# Patient Record
Sex: Male | Born: 1990 | Race: Black or African American | Hispanic: No | Marital: Married | Smoking: Never smoker
Health system: Southern US, Community
[De-identification: ages and names within clinical notes are randomized; demographics above are authoritative.]

## PROBLEM LIST (undated history)

## (undated) DIAGNOSIS — I4891 Unspecified atrial fibrillation: Secondary | ICD-10-CM

## (undated) HISTORY — PX: BUNIONECTOMY: SHX129

---
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
## 2016-07-14 ENCOUNTER — Ambulatory Visit (INDEPENDENT_AMBULATORY_CARE_PROVIDER_SITE_OTHER): Payer: BLUE CROSS/BLUE SHIELD

## 2016-07-14 ENCOUNTER — Ambulatory Visit (INDEPENDENT_AMBULATORY_CARE_PROVIDER_SITE_OTHER): Payer: BLUE CROSS/BLUE SHIELD | Admitting: Podiatry

## 2016-07-14 ENCOUNTER — Encounter: Payer: Self-pay | Admitting: Podiatry

## 2016-07-14 VITALS — Resp 16 | Ht 67.0 in | Wt 175.0 lb

## 2016-07-14 DIAGNOSIS — M21629 Bunionette of unspecified foot: Secondary | ICD-10-CM | POA: Diagnosis not present

## 2016-07-14 DIAGNOSIS — M2010 Hallux valgus (acquired), unspecified foot: Secondary | ICD-10-CM

## 2016-07-14 NOTE — Progress Notes (Signed)
   Subjective:    Patient ID: Gabriel Berger, male    DOB: Feb 16, 1991, 26 y.o.   MRN: [id number]  HPI  Chief Complaint  Patient presents with  . Bunions    BL; Painful x 1 year.        Review of Systems     Objective:   Physical Exam        Assessment & Plan:

## 2016-07-14 NOTE — Patient Instructions (Addendum)
Pre-Operative Instructions  Congratulations, you have decided to take an important step to improving your quality of life.  You can be assured that the doctors of [facility] will be with you every step of the way.  1. Plan to be at the surgery center/hospital at least 1 (one) hour prior to your scheduled time unless otherwise directed by the surgical center/hospital staff.  You must have a responsible adult accompany you, remain during the surgery and drive you home.  Make sure you have directions to the surgical center/hospital and know how to get there on time. 2. For hospital based surgery you will need to obtain a history and physical form from your family physician within 1 month prior to the date of surgery- we will give you a form for you primary physician.  3. We make every effort to accommodate the date you request for surgery.  There are however, times where surgery dates or times have to be moved.  We will contact you as soon as possible if a change in schedule is required.   4. No Aspirin/Ibuprofen for one week before surgery.  If you are on aspirin, any non-steroidal anti-inflammatory medications (Mobic, Aleve, Ibuprofen) you should stop taking it 7 days prior to your surgery.  You make take Tylenol  For pain prior to surgery.  5. Medications- If you are taking daily heart and blood pressure medications, seizure, reflux, allergy, asthma, anxiety, pain or diabetes medications, make sure the surgery center/hospital is aware before the day of surgery so they may notify you which medications to take or avoid the day of surgery. 6. No food or drink after midnight the night before surgery unless directed otherwise by surgical center/hospital staff. 7. No alcoholic beverages 24 hours prior to surgery.  No smoking 24 hours prior to or 24 hours after surgery. 8. Wear loose pants or shorts- loose enough to fit over bandages, boots, and casts. 9. No slip on shoes, sneakers are best. 10. Bring  your boot with you to the surgery center/hospital.  Also bring crutches or a walker if your physician has prescribed it for you.  If you do not have this equipment, it will be provided for you after surgery. 11. If you have not been contracted by the surgery center/hospital by the day before your surgery, call to confirm the date and time of your surgery. 12. Leave-time from work may vary depending on the type of surgery you have.  Appropriate arrangements should be made prior to surgery with your employer. 13. Prescriptions will be provided immediately following surgery by your doctor.  Have these filled as soon as possible after surgery and take the medication as directed. 14. Remove nail polish on the operative foot. 15. Wash the night before surgery.  The night before surgery wash the foot and leg well with the antibacterial soap provided and water paying special attention to beneath the toenails and in between the toes.  Rinse thoroughly with water and dry well with a towel.  Perform this wash unless told not to do so by your physician.  Enclosed: 1 Ice pack (please put in freezer the night before surgery)   1 Hibiclens skin cleaner   Pre-op Instructions  If you have any questions regarding the instructions, do not hesitate to call our office.  [place]: [address] [place], [place] [address] [phone]  [place]: [address] [place], [place] [address] [phone]  [place]: [address]  [place], [place] [address] [phone]   Dr.   Norman Regal DPM, Dr. Matthew Wagoner DPM, Dr. M. Todd Hyatt DPM, Dr. Titorya Stover DPM    Bunionectomy A bunionectomy is a surgical procedure to remove a bunion. A bunion is a visible bump of bone on the inside of your foot where your big toe meets the rest of your foot. A bunion can develop when pressure turns this bone (first metatarsal) toward the other toes. Shoes that are too tight are the most common cause of bunions. Bunions can also be caused  by diseases, such as arthritis and polio. You may need a bunionectomy if your bunion is very large and painful or it affects your ability to walk. Tell a health care provider about:  Any allergies you have.  All medicines you are taking, including vitamins, herbs, eye drops, creams, and over-the-counter medicines.  Any problems you or family members have had with anesthetic medicines.  Any blood disorders you have.  Any surgeries you have had.  Any medical conditions you have. What are the risks? Generally, this is a safe procedure. However, problems may occur, including:  Infection.  Pain.  Nerve damage.  Bleeding or blood clots.  Reactions to medicines.  Numbness, stiffness, or arthritis in your toe.  Foot problems that continue even after the procedure. What happens before the procedure?  Ask your health care provider about:  Changing or stopping your regular medicines. This is especially important if you are taking diabetes medicines or blood thinners.  Taking medicines such as aspirin and ibuprofen. These medicines can thin your blood. Do not take these medicines before your procedure if your health care provider instructs you not to.  Do not drink alcohol before the procedure as directed by your health care provider.  Do not use tobacco products, including cigarettes, chewing tobacco, or electronic cigarettes, before the procedure as directed by your health care provider. If you need help quitting, ask your health care provider.  Ask your health care provider what kind of medicine you will be given during your procedure. A bunionectomy may be done using one of these:  A medicine that numbs the area (local anesthetic).  A medicine that makes you go to sleep (general anesthetic). If you will be given general anesthetic, do not eat or drink anything after midnight on the night before the procedure or as directed by your health care provider. What happens during the  procedure?  An IV tube may be inserted into a vein.  You will be given local anesthetic or general anesthetic.  The surgeon will make a cut (incision) over the enlarged area at the first joint of the big toe. The surgeon will remove the bunion.  You may have more than one incision if any of the bones in your big toe need to be moved. A bone itself may need to be cut.  Sometimes the tissues around the big toe may also need to be cut then tightened or loosened to reposition the toe.  Screws or other hardware may be used to keep your foot in thecorrect position.  The incision will be closed with stitches (sutures) and covered with adhesive strips or another type of bandage (dressing). What happens after the procedure?  You may spend some time in a recovery area.  Your blood pressure, heart rate, breathing rate, and blood oxygen level will be monitored often until the medicines you were given have worn off. This information is not intended to replace advice given to you by your health care provider. Make sure   questions you have with your health care provider. Document Released: 03/26/2005 Document Revised: 09/18/2015 Document Reviewed: 11/28/2013 Elsevier Interactive Patient Education  2017 [employer]

## 2016-07-14 NOTE — Progress Notes (Signed)
Subjective:     Patient ID: Gabriel Berger, male   DOB: December 24, 1990, 26 y.o.   MRN: [id number]  HPI patient presents with significant bunion deformity left over right with chronic pain of several years duration. States that he's tried wider shoes she's tried soaking it and padding without relief and it just seems to get worse over time. Patient does not know anyone in the family that has it but suspicion is that this is hereditary   Review of Systems  All other systems reviewed and are negative.      Objective:   Physical Exam  Constitutional: He is oriented to person, place, and time.  Cardiovascular: Intact distal pulses.   Musculoskeletal: Normal range of motion.  Neurological: He is oriented to person, place, and time.  Skin: Skin is warm.  Nursing note and vitals reviewed.  neurovascular status found to be intact with muscle strength adequate range of motion within normal limits. Patient's noted to have significant forefoot derangement left over right with large bunion formation that red and painful medial rotation of the hallux left over right and prominent metatarsal fifth left and fifth right that are painful. 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 I do think that this will require surgical intervention due to his young age and pain and he is in complete agreement and he wants to get it done as soon as possible as he has made this decision. I did go ahead today and I allowed him to read the consent form going over alternative treatments complications and that we will do the left foot first and hopefully  follow up with the right foot 2-3 weeks later. I reviewed this in great detail with him and he understands all possible complications as outlined and the recovery from this type procedure we'll take 6 months to one year. Patient is scheduled [facility] and is given all preoperative instructions and is strongly encouraged to call if he should have any questions. I also dispensed air fracture walker that he will begin wearing now to get used to it for the postoperative period and find shoes that we'll work  X-rays indicate that there is elevation of the intermetatarsal angle of approximately 17 left 15 right moderate medial mild rotation of the hallux bilateral and prominence the fourth metatarsal head bilateral

## 2016-07-30 ENCOUNTER — Telehealth: Payer: Self-pay | Admitting: *Deleted

## 2016-07-30 NOTE — Telephone Encounter (Signed)
"  When patient filled out his information on the portal, he put down that he was having surgery on the right foot but all his paperwork says left foot."  Dr. Beverlee Nims notes say the left is the worse and he informed patient that he would do the left foot first.  "Well can you call the patient and let him know?"  Yes, I'll call him.   I received a call from the surgical center.  They said you put on their portal that you were having your right foot done first.  You signed a consent form for the left foot.  "To be honest, I forgot which foot I was having done first.  I'm having both of them done just could not remember which one first."  He's doing the left foot first.  I will call and let the scheduler know at the surgical center.  I called and informed Aram Beecham that patient is having the left foot done first.  She stated she would let the nurse know.

## 2016-08-02 ENCOUNTER — Telehealth: Payer: Self-pay | Admitting: *Deleted

## 2016-08-02 ENCOUNTER — Encounter: Payer: Self-pay | Admitting: *Deleted

## 2016-08-02 NOTE — Telephone Encounter (Addendum)
Pt states he is having bunionectomies on 08/03/2016 and his employer needs verification and an estimated period of time out of work. Pt states he is have both feet done and his doctor said he would be out of work 10-12 weeks. Pt request note be faxed to [place] at [phone]. Letter faxed to [facility].08/03/2016-Kathleen- [employer] states they need a letter stating pt is having surgery today. Retyped and faxed letter dated today with today as surgery date. 08/06/2016-Pt states his insurance would only cover 3 Percocet 10/325 a day and only #21. Dr. Everlena Cooper refill at this time. Pt was instructed to pick up the rx in the [place] office.

## 2016-08-03 ENCOUNTER — Encounter: Payer: Self-pay | Admitting: Podiatry

## 2016-08-03 ENCOUNTER — Encounter: Payer: Self-pay | Admitting: *Deleted

## 2016-08-03 DIAGNOSIS — M21542 Acquired clubfoot, left foot: Secondary | ICD-10-CM | POA: Diagnosis not present

## 2016-08-03 DIAGNOSIS — M2012 Hallux valgus (acquired), left foot: Secondary | ICD-10-CM | POA: Diagnosis not present

## 2016-08-06 MED ORDER — OXYCODONE-ACETAMINOPHEN 10-325 MG PO TABS: 1.0000 | ORAL_TABLET | Freq: Three times a day (TID) | ORAL | 0 refills | Status: DC | PRN

## 2016-08-12 ENCOUNTER — Ambulatory Visit (INDEPENDENT_AMBULATORY_CARE_PROVIDER_SITE_OTHER): Payer: BLUE CROSS/BLUE SHIELD

## 2016-08-12 ENCOUNTER — Ambulatory Visit (INDEPENDENT_AMBULATORY_CARE_PROVIDER_SITE_OTHER): Payer: BLUE CROSS/BLUE SHIELD | Admitting: Podiatry

## 2016-08-12 ENCOUNTER — Encounter: Payer: Self-pay | Admitting: Podiatry

## 2016-08-12 VITALS — Temp 98.3°F

## 2016-08-12 DIAGNOSIS — M2010 Hallux valgus (acquired), unspecified foot: Secondary | ICD-10-CM

## 2016-08-12 DIAGNOSIS — M21629 Bunionette of unspecified foot: Secondary | ICD-10-CM

## 2016-08-12 MED ORDER — OXYCODONE-ACETAMINOPHEN 5-325 MG PO TABS: 1.0000 | ORAL_TABLET | Freq: Three times a day (TID) | ORAL | 0 refills | Status: DC | PRN

## 2016-08-13 NOTE — Progress Notes (Signed)
Subjective:     Patient ID: Gabriel Berger, male   DOB: [birth year], 26 y.o.   MRN: [id number]  HPI patient presents stating that his left foot is doing well and he would like to get the right foot fixed as soon as possible as he will be out of work   Review of Systems     Objective:   Physical Exam Neurovascular status intact negative Homans sign was noted with well coapted incision site left first metatarsal left big toe and left fifth metatarsal. There is excellent alignment noted there is moderate edema across the forefoot from activity that wound edges are well coapted with no drainage     Assessment:     Doing well post foot surgery left with good alignment noted and no indications of pathology currently     Plan:     H&P x-rays were reviewed and compression reapplied with instructions on elevation immobilization and I did dispensed surgical shoe for home usage. I will reevaluate in 2 weeks and we will consider then went would be best to schedule the second surgery for his other foot   X-ray indicates osteotomy is healing well with no movement and good pin screw and wire fixation

## 2016-08-26 ENCOUNTER — Ambulatory Visit (INDEPENDENT_AMBULATORY_CARE_PROVIDER_SITE_OTHER): Payer: BLUE CROSS/BLUE SHIELD

## 2016-08-26 ENCOUNTER — Telehealth: Payer: Self-pay | Admitting: Podiatry

## 2016-08-26 ENCOUNTER — Ambulatory Visit (INDEPENDENT_AMBULATORY_CARE_PROVIDER_SITE_OTHER): Payer: BLUE CROSS/BLUE SHIELD | Admitting: Podiatry

## 2016-08-26 DIAGNOSIS — M2010 Hallux valgus (acquired), unspecified foot: Secondary | ICD-10-CM

## 2016-08-26 DIAGNOSIS — M21629 Bunionette of unspecified foot: Secondary | ICD-10-CM | POA: Diagnosis not present

## 2016-08-26 DIAGNOSIS — Z9889 Other specified postprocedural states: Secondary | ICD-10-CM

## 2016-08-26 NOTE — Telephone Encounter (Signed)
Patient called back saying that his job let him go and he sent in payment for [employer] and that it should show in the next week or two that he still has the insurance but under [employer].

## 2016-08-26 NOTE — Telephone Encounter (Signed)
Called patient in regards to his insurance for his surgery scheduled for next Tuesday 08 May. Asked patient to call us back.

## 2016-08-28 NOTE — Progress Notes (Signed)
Subjective:    Gabriel Berger ID: Gabriel Berger, male   DOB: 26 y.o.   MRN: [id number]   HPI Gabriel Berger states she's doing great and is ready to get his other foot fixed. Like to get it done as soon as possible    ROS      Objective:  Physical Exam Neurovascular status intact muscle strength was adequate with Gabriel Berger found to have well structured first metatarsal left and fifth metatarsal left along with Akin osteotomy. Wound edges well coapted hallux in rectus position with range of motion adequate    Assessment:    Doing well post forefoot reconstruction left and wants right foot fixed     Plan:     H&P condition reviewed and I've recommended that we go ahead and correct the right foot while the left foot finishes healing. I explained procedure and risk and Gabriel Berger wants surgery and today I went ahead and I allowed Gabriel Berger to read consent form after going over alternative treatments complications and the fact recovery will take approximate 6 months to one year. Gabriel Berger signed consent form for right foot and is scheduled for outpatient surgery

## 2016-08-30 NOTE — Telephone Encounter (Signed)
"  I have a surgery scheduled tomorrow morning with Dr. Charlsie Merlesegal.  I have some issues with my insurance.  Can you give me a call back."

## 2016-09-02 ENCOUNTER — Telehealth: Payer: Self-pay | Admitting: *Deleted

## 2016-09-02 NOTE — Telephone Encounter (Signed)
"  I'm calling to see when my surgery was rescheduled to."  We have you scheduled for Sep 14, 2016.  "Okay, thank you."

## 2016-09-06 ENCOUNTER — Other Ambulatory Visit: Payer: BLUE CROSS/BLUE SHIELD

## 2016-09-09 ENCOUNTER — Telehealth: Payer: Self-pay | Admitting: *Deleted

## 2016-09-09 NOTE — Telephone Encounter (Signed)
"  I'm calling to see if I can cancel my surgery scheduled for Tuesday of next week the 22nd.  I just want to get the natural feeling of this other foot that I had done before I have this one done.  If you can, give me a call back.

## 2016-09-10 NOTE — Telephone Encounter (Signed)
"  I'm returning your call.  You want to cancel your surgery?  "Yes, I want to cancel it for now."  Would you like to reschedule it?  "No, not at this time"

## 2016-09-10 NOTE — Progress Notes (Signed)
DOS 08/03/2016 [place] Bunionectomy(cutting and moving bone) with pin fixation: Aiken osteotomy with wire (left): Metatarsal osteotomy with screw 5th(left)

## 2016-09-13 ENCOUNTER — Other Ambulatory Visit: Payer: BLUE CROSS/BLUE SHIELD

## 2016-09-22 ENCOUNTER — Encounter: Payer: BLUE CROSS/BLUE SHIELD | Admitting: Podiatry

## 2016-09-23 ENCOUNTER — Other Ambulatory Visit: Payer: BLUE CROSS/BLUE SHIELD

## 2016-09-23 ENCOUNTER — Ambulatory Visit: Payer: BLUE CROSS/BLUE SHIELD | Admitting: Podiatry

## 2016-09-27 ENCOUNTER — Encounter: Payer: BLUE CROSS/BLUE SHIELD | Admitting: Podiatry

## 2016-10-07 ENCOUNTER — Encounter: Payer: BLUE CROSS/BLUE SHIELD | Admitting: Podiatry

## 2019-05-20 DIAGNOSIS — Z20828 Contact with and (suspected) exposure to other viral communicable diseases: Secondary | ICD-10-CM | POA: Diagnosis not present

## 2019-05-20 DIAGNOSIS — Z03818 Encounter for observation for suspected exposure to other biological agents ruled out: Secondary | ICD-10-CM | POA: Diagnosis not present

## 2019-06-21 DIAGNOSIS — Z113 Encounter for screening for infections with a predominantly sexual mode of transmission: Secondary | ICD-10-CM | POA: Diagnosis not present

## 2019-06-21 DIAGNOSIS — R350 Frequency of micturition: Secondary | ICD-10-CM | POA: Diagnosis not present

## 2019-07-24 DIAGNOSIS — M21962 Unspecified acquired deformity of left lower leg: Secondary | ICD-10-CM | POA: Diagnosis not present

## 2019-07-24 DIAGNOSIS — M205X2 Other deformities of toe(s) (acquired), left foot: Secondary | ICD-10-CM | POA: Diagnosis not present

## 2019-07-24 DIAGNOSIS — M21611 Bunion of right foot: Secondary | ICD-10-CM | POA: Diagnosis not present

## 2019-07-24 DIAGNOSIS — T8484XA Pain due to internal orthopedic prosthetic devices, implants and grafts, initial encounter: Secondary | ICD-10-CM | POA: Diagnosis not present

## 2019-08-23 DIAGNOSIS — M21611 Bunion of right foot: Secondary | ICD-10-CM | POA: Diagnosis not present

## 2019-08-23 DIAGNOSIS — M205X1 Other deformities of toe(s) (acquired), right foot: Secondary | ICD-10-CM | POA: Diagnosis not present

## 2019-08-23 DIAGNOSIS — M21621 Bunionette of right foot: Secondary | ICD-10-CM | POA: Diagnosis not present

## 2019-08-23 DIAGNOSIS — T8484XA Pain due to internal orthopedic prosthetic devices, implants and grafts, initial encounter: Secondary | ICD-10-CM | POA: Diagnosis not present

## 2019-08-27 DIAGNOSIS — Z01818 Encounter for other preprocedural examination: Secondary | ICD-10-CM | POA: Diagnosis not present

## 2019-08-27 DIAGNOSIS — G8929 Other chronic pain: Secondary | ICD-10-CM | POA: Diagnosis not present

## 2019-12-24 DIAGNOSIS — H02849 Edema of unspecified eye, unspecified eyelid: Secondary | ICD-10-CM | POA: Diagnosis not present

## 2019-12-24 DIAGNOSIS — R220 Localized swelling, mass and lump, head: Secondary | ICD-10-CM | POA: Diagnosis not present

## 2020-01-01 DIAGNOSIS — H00021 Hordeolum internum right upper eyelid: Secondary | ICD-10-CM | POA: Diagnosis not present

## 2020-05-22 DIAGNOSIS — Z20822 Contact with and (suspected) exposure to covid-19: Secondary | ICD-10-CM | POA: Diagnosis not present

## 2020-06-05 DIAGNOSIS — H00021 Hordeolum internum right upper eyelid: Secondary | ICD-10-CM | POA: Diagnosis not present

## 2020-06-05 DIAGNOSIS — H00014 Hordeolum externum left upper eyelid: Secondary | ICD-10-CM | POA: Diagnosis not present

## 2022-06-02 ENCOUNTER — Ambulatory Visit: Payer: BC Managed Care – PPO | Admitting: Medical

## 2022-06-02 VITALS — BP 120/70 | HR 72 | Ht 67.0 in | Wt 261.2 lb

## 2022-06-02 DIAGNOSIS — R0982 Postnasal drip: Secondary | ICD-10-CM | POA: Diagnosis not present

## 2022-06-02 DIAGNOSIS — K219 Gastro-esophageal reflux disease without esophagitis: Secondary | ICD-10-CM | POA: Diagnosis not present

## 2022-06-02 DIAGNOSIS — R350 Frequency of micturition: Secondary | ICD-10-CM | POA: Diagnosis not present

## 2022-06-02 LAB — POCT URINALYSIS DIP (PROADVANTAGE DEVICE)
Blood, UA: NEGATIVE
Glucose, UA: NEGATIVE mg/dL
Leukocytes, UA: NEGATIVE
Nitrite, UA: NEGATIVE
Protein Ur, POC: NEGATIVE mg/dL
Specific Gravity, Urine: 1.025
Urobilinogen, Ur: NEGATIVE
pH, UA: 6 (ref 5.0–8.0)

## 2022-06-02 MED ORDER — OMEPRAZOLE 40 MG PO CPDR: 40.0000 mg | DELAYED_RELEASE_CAPSULE | Freq: Every day | ORAL | 1 refills | Status: DC

## 2022-06-02 MED ORDER — CETIRIZINE HCL 10 MG PO TABS: 10.0000 mg | ORAL_TABLET | Freq: Every day | ORAL | 1 refills | Status: DC

## 2022-06-02 NOTE — Patient Instructions (Addendum)
Recommendations: For acid reflux and heartburn began omeprazole 40 mg daily.  This should be taken about 30 to 45 minutes before breakfast Avoid foods that make acid reflux worse Avoid laying down shortly after eating and do not rush through meals Begin cetirizine at nighttime for allergies and congestion Lets see how you do over the next few weeks on this regimen You could also add an over-the-counter Flonase nasal spray if you do not feel like the nasal congestion is improving Your urine today does not show any signs of infection or diabetes Try to drink at least 60 to 70 ounces of water a day If your symptoms above improve over the next few weeks you can either continue the medications above or do just a 2 or 3-week trial then stop Follow-up in 2 months for a fasting physical   Gastroesophageal Reflux Disease, Adult Gastroesophageal reflux disease (GERD) happens when acid from your stomach flows up into the esophagus. When acid comes in contact with the esophagus, the acid causes soreness (inflammation) in the esophagus. Over time, GERD may create small holes (ulcers) in the lining of the esophagus. CAUSES  Increased body weight. This puts pressure on the stomach, making acid rise from the stomach into the esophagus.  Smoking. This increases acid production in the stomach.  Drinking alcohol. This causes decreased pressure in the lower esophageal sphincter (valve or ring of muscle between the esophagus and stomach), allowing acid from the stomach into the esophagus.  Late evening meals and a full stomach. This increases pressure and acid production in the stomach.  A malformed lower esophageal sphincter.  Sometimes, no cause is found. SYMPTOMS  Burning pain in the lower part of the mid-chest behind the breastbone and in the mid-stomach area. This may occur twice a week or more often.  Trouble swallowing.  Sore throat.  Dry cough.  Asthma-like symptoms including chest tightness, shortness  of breath, or wheezing.  DIAGNOSIS  Your caregiver may be able to diagnose GERD based on your symptoms. In some cases, X-rays and other tests may be done to check for complications or to check the condition of your stomach and esophagus. TREATMENT  Your caregiver may recommend over-the-counter or prescription medicines to help decrease acid production. Ask your caregiver before starting or adding any new medicines.  HOME CARE INSTRUCTIONS  Change the factors that you can control. Ask your caregiver for guidance concerning weight loss, quitting smoking, and alcohol consumption.  Avoid foods and drinks that make your symptoms worse, such as:  Caffeine or alcoholic drinks.  Chocolate.  Peppermint or mint flavorings.  Garlic and onions.  Spicy foods.  Citrus fruits, such as oranges, lemons, or limes.  Tomato-based foods such as sauce, chili, salsa, and pizza.  Fried and fatty foods.  Avoid lying down for the 3 hours prior to your bedtime or prior to taking a nap.  Eat small, frequent meals instead of large meals.  Wear loose-fitting clothing. Do not wear anything tight around your waist that causes pressure on your stomach.  Raise the head of your bed 6 to 8 inches with wood blocks to help you sleep. Extra pillows will not help.  Only take over-the-counter or prescription medicines for pain, discomfort, or fever as directed by your caregiver.  Do not take aspirin, ibuprofen, or other nonsteroidal anti-inflammatory drugs (NSAIDs).  SEEK IMMEDIATE MEDICAL CARE IF:  You have pain in your arms, neck, jaw, teeth, or back.  Your pain increases or changes in intensity or duration.  You develop nausea, vomiting, or sweating (diaphoresis).  You develop shortness of breath, or you faint.  Your vomit is green, yellow, black, or looks like coffee grounds or blood.  Your stool is red, bloody, or black.  These symptoms could be signs of other problems, such as heart disease, gastric bleeding, or  esophageal bleeding. MAKE SURE YOU:  Understand these instructions.  Will watch your condition.  Will get help right away if you are not doing well or get worse.  Document Released: 01/20/2005 Document Revised: 12/23/2010 Document Reviewed: 10/30/2010 [facility] Patient Information 2012 [place].

## 2022-06-02 NOTE — Progress Notes (Signed)
Subjective:  Gabriel Berger is a 32 y.o. male who presents for Chief Complaint  Patient presents with   get established    Get established. Having some acid reflux. 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  No PCP in a while  He notes some problems with mucous and GERD.  Having to urinate more frequently of late.    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  He notes GERD, feeling heartburn.  Was worse, prior would awake in the middle of the night feeling like he would want to vomit. Having to clear throat all day.  Not a lot acidic foods.  Not taking anything for this.  Feels a lot of post nasal drainage and mucous.    No coughing.  Nonsmoker.   No other aggravating or relieving factors.    No other c/o.  No past medical history on file. No current outpatient medications on file prior to visit.   No current facility-administered medications on file prior to visit.    The following portions of the patient's history were reviewed and updated as appropriate: allergies, current medications, past family history, past medical history, past social history, past surgical history and problem list.  ROS Otherwise as in subjective above  Objective: BP 120/70   Pulse 72   Ht 5\' 7"  (1.702 m)   Wt 261 lb 3.2 oz (118.5 kg)   BMI 40.91 kg/m   General appearance: alert, no distress, well developed, well nourished HEENT: normocephalic, sclerae anicteric, conjunctiva pink and moist, TMs pearly, nares patent, no discharge or erythema, pharynx normal Oral cavity: MMM, no lesions Neck: supple, no lymphadenopathy, no thyromegaly, no masses Heart: RRR, normal S1, S2, no murmurs Lungs: CTA bilaterally, no wheezes, rhonchi, or rales Abdomen: +bs, soft, non tender, non distended, no masses, no hepatomegaly, no splenomegaly Pulses: 2+ radial pulses, 2+ pedal pulses, normal cap refill Ext: no  edema   Assessment: Encounter Diagnoses  Name Primary?   Gastroesophageal reflux disease, unspecified whether esophagitis present Yes   Post-nasal drainage    Urinary frequency      Plan: We discussed symptoms, concerns, recommendations as below  Patient Instructions  Recommendations: For acid reflux and heartburn began omeprazole 40 mg daily.  This should be taken about 30 to 45 minutes before breakfast Avoid foods that make acid reflux worse Avoid laying down shortly after eating and do not rush through meals Begin cetirizine at nighttime for allergies and congestion Lets see how you do over the next few weeks on this regimen You could also add an over-the-counter Flonase nasal spray if you do not feel like the nasal congestion is improving Your urine today does not show any signs of infection or diabetes Try to drink at least 60 to 70 ounces of water a day If your symptoms above improve over the next few weeks you can either continue the medications above or do just a 2 or 3-week trial then stop Follow-up in 2 months for a fasting physical   Gastroesophageal Reflux Disease, Adult Gastroesophageal reflux disease (GERD) happens when acid from your stomach flows up into the esophagus. When acid comes in contact with the esophagus, the acid causes soreness (inflammation) in the esophagus. Over time, GERD may create small holes (ulcers) in the lining of the esophagus. CAUSES  Increased body weight. This puts pressure on the stomach, making acid rise from the stomach into  the esophagus.  Smoking. This increases acid production in the stomach.  Drinking alcohol. This causes decreased pressure in the lower esophageal sphincter (valve or ring of muscle between the esophagus and stomach), allowing acid from the stomach into the esophagus.  Late evening meals and a full stomach. This increases pressure and acid production in the stomach.  A malformed lower esophageal sphincter.   Sometimes, no cause is found. SYMPTOMS  Burning pain in the lower part of the mid-chest behind the breastbone and in the mid-stomach area. This may occur twice a week or more often.  Trouble swallowing.  Sore throat.  Dry cough.  Asthma-like symptoms including chest tightness, shortness of breath, or wheezing.  DIAGNOSIS  Your caregiver may be able to diagnose GERD based on your symptoms. In some cases, X-rays and other tests may be done to check for complications or to check the condition of your stomach and esophagus. TREATMENT  Your caregiver may recommend over-the-counter or prescription medicines to help decrease acid production. Ask your caregiver before starting or adding any new medicines.  HOME CARE INSTRUCTIONS  Change the factors that you can control. Ask your caregiver for guidance concerning weight loss, quitting smoking, and alcohol consumption.  Avoid foods and drinks that make your symptoms worse, such as:  Caffeine or alcoholic drinks.  Chocolate.  Peppermint or mint flavorings.  Garlic and onions.  Spicy foods.  Citrus fruits, such as oranges, lemons, or limes.  Tomato-based foods such as sauce, chili, salsa, and pizza.  Fried and fatty foods.  Avoid lying down for the 3 hours prior to your bedtime or prior to taking a nap.  Eat small, frequent meals instead of large meals.  Wear loose-fitting clothing. Do not wear anything tight around your waist that causes pressure on your stomach.  Raise the head of your bed 6 to 8 inches with wood blocks to help you sleep. Extra pillows will not help.  Only take over-the-counter or prescription medicines for pain, discomfort, or fever as directed by your caregiver.  Do not take aspirin, ibuprofen, or other nonsteroidal anti-inflammatory drugs (NSAIDs).  SEEK IMMEDIATE MEDICAL CARE IF:  You have pain in your arms, neck, jaw, teeth, or back.  Your pain increases or changes in intensity or duration.  You develop nausea, vomiting,  or sweating (diaphoresis).  You develop shortness of breath, or you faint.  Your vomit is green, yellow, black, or looks like coffee grounds or blood.  Your stool is red, bloody, or black.  These symptoms could be signs of other problems, such as heart disease, gastric bleeding, or esophageal bleeding. MAKE SURE YOU:  Understand these instructions.  Will watch your condition.  Will get help right away if you are not doing well or get worse.  Document Released: 01/20/2005 Document Revised: 12/23/2010 Document Reviewed: 10/30/2010 [facility] Patient Information 2012 [place].   Saurabh was seen today for get established.  Diagnoses and all orders for this visit:  Gastroesophageal reflux disease, unspecified whether esophagitis present  Post-nasal drainage  Urinary frequency  Other orders -     omeprazole (PRILOSEC) 40 MG capsule; Take 1 capsule (40 mg total) by mouth daily. -     cetirizine (ZYRTEC) 10 MG tablet; Take 1 tablet (10 mg total) by mouth at bedtime.    Follow up: in a few months for fasting physical

## 2022-11-23 DIAGNOSIS — C44319 Basal cell carcinoma of skin of other parts of face: Secondary | ICD-10-CM | POA: Diagnosis not present

## 2022-11-23 DIAGNOSIS — Q8789 Other specified congenital malformation syndromes, not elsewhere classified: Secondary | ICD-10-CM | POA: Diagnosis not present

## 2022-11-23 DIAGNOSIS — C44311 Basal cell carcinoma of skin of nose: Secondary | ICD-10-CM | POA: Diagnosis not present

## 2022-11-23 DIAGNOSIS — D492 Neoplasm of unspecified behavior of bone, soft tissue, and skin: Secondary | ICD-10-CM | POA: Diagnosis not present

## 2023-01-10 DIAGNOSIS — N50811 Right testicular pain: Secondary | ICD-10-CM | POA: Diagnosis not present

## 2023-01-10 DIAGNOSIS — R3915 Urgency of urination: Secondary | ICD-10-CM | POA: Diagnosis not present

## 2023-01-10 DIAGNOSIS — N50812 Left testicular pain: Secondary | ICD-10-CM | POA: Diagnosis not present

## 2023-01-17 DIAGNOSIS — C44319 Basal cell carcinoma of skin of other parts of face: Secondary | ICD-10-CM | POA: Diagnosis not present

## 2023-01-17 DIAGNOSIS — C44311 Basal cell carcinoma of skin of nose: Secondary | ICD-10-CM | POA: Diagnosis not present

## 2023-01-18 ENCOUNTER — Other Ambulatory Visit: Payer: Self-pay

## 2023-01-18 ENCOUNTER — Emergency Department (HOSPITAL_COMMUNITY): Payer: BC Managed Care – PPO

## 2023-01-18 ENCOUNTER — Encounter (HOSPITAL_COMMUNITY): Payer: Self-pay

## 2023-01-18 ENCOUNTER — Emergency Department (HOSPITAL_COMMUNITY)
Admission: EM | Admit: 2023-01-18 | Discharge: 2023-01-18 | Disposition: A | Discharge status: Home / Self Care | Payer: BC Managed Care – PPO | Attending: Emergency Medicine | Admitting: Emergency Medicine

## 2023-01-18 DIAGNOSIS — R0789 Other chest pain: Secondary | ICD-10-CM | POA: Diagnosis not present

## 2023-01-18 DIAGNOSIS — R030 Elevated blood-pressure reading, without diagnosis of hypertension: Secondary | ICD-10-CM | POA: Diagnosis not present

## 2023-01-18 DIAGNOSIS — R002 Palpitations: Secondary | ICD-10-CM | POA: Diagnosis not present

## 2023-01-18 DIAGNOSIS — R9431 Abnormal electrocardiogram [ECG] [EKG]: Secondary | ICD-10-CM | POA: Diagnosis not present

## 2023-01-18 DIAGNOSIS — I4891 Unspecified atrial fibrillation: Secondary | ICD-10-CM | POA: Diagnosis not present

## 2023-01-18 DIAGNOSIS — Z7901 Long term (current) use of anticoagulants: Secondary | ICD-10-CM | POA: Diagnosis not present

## 2023-01-18 LAB — CBC
HCT: 50.6 % (ref 39.0–52.0)
Hemoglobin: 17.2 g/dL — ABNORMAL HIGH (ref 13.0–17.0)
MCH: 28.9 pg (ref 26.0–34.0)
MCHC: 34 g/dL (ref 30.0–36.0)
MCV: 84.9 fL (ref 80.0–100.0)
Platelets: 228 10*3/uL (ref 150–400)
RBC: 5.96 MIL/uL — ABNORMAL HIGH (ref 4.22–5.81)
RDW: 13.2 % (ref 11.5–15.5)
WBC: 10 10*3/uL (ref 4.0–10.5)
nRBC: 0 % (ref 0.0–0.2)

## 2023-01-18 LAB — TSH: TSH: 1.368 u[IU]/mL (ref 0.350–4.500)

## 2023-01-18 LAB — BASIC METABOLIC PANEL
Anion gap: 12 (ref 5–15)
BUN: 10 mg/dL (ref 6–20)
CO2: 23 mmol/L (ref 22–32)
Calcium: 9.3 mg/dL (ref 8.9–10.3)
Chloride: 101 mmol/L (ref 98–111)
Creatinine, Ser: 0.81 mg/dL (ref 0.61–1.24)
GFR, Estimated: >60 mL/min (ref >60)
Glucose, Bld: 121 mg/dL — ABNORMAL HIGH (ref 70–99)
Potassium: 4.1 mmol/L (ref 3.5–5.1)
Sodium: 136 mmol/L (ref 135–145)

## 2023-01-18 LAB — TROPONIN I (HIGH SENSITIVITY): Troponin I (High Sensitivity): 3 ng/L (ref <18)

## 2023-01-18 LAB — MAGNESIUM: Magnesium: 2 mg/dL (ref 1.7–2.4)

## 2023-01-18 MED ORDER — MIDAZOLAM HCL 2 MG/2ML IJ SOLN
1.0000 mg | Freq: Once | INTRAMUSCULAR | Status: AC
  Administered 2023-01-18: 1 mg via INTRAVENOUS
  Filled 2023-01-18: qty 2

## 2023-01-18 MED ORDER — APIXABAN 5 MG PO TABS
5.0000 mg | ORAL_TABLET | Freq: Once | ORAL | Status: AC
  Administered 2023-01-18: 5 mg via ORAL
  Filled 2023-01-18: qty 1

## 2023-01-18 MED ORDER — METOPROLOL TARTRATE 5 MG/5ML IV SOLN
5.0000 mg | Freq: Once | INTRAVENOUS | Status: AC
  Administered 2023-01-18: 5 mg via INTRAVENOUS
  Filled 2023-01-18: qty 5

## 2023-01-18 MED ORDER — APIXABAN 5 MG PO TABS: 5.0000 mg | ORAL_TABLET | Freq: Two times a day (BID) | ORAL | 0 refills | Status: DC

## 2023-01-18 MED ORDER — ETOMIDATE 2 MG/ML IV SOLN
10.0000 mg | Freq: Once | INTRAVENOUS | Status: AC
  Administered 2023-01-18: 10 mg via INTRAVENOUS

## 2023-01-18 NOTE — Discharge Instructions (Addendum)

## 2023-01-18 NOTE — ED Triage Notes (Signed)
PT sent by [facility] for abnormal EKG. PT state woke up at 0600 due to feeling heart tachycardia and off beat. PT denies chest pain. Pt states has slight SOB.

## 2023-01-18 NOTE — ED Provider Notes (Signed)
[place] EMERGENCY DEPARTMENT AT [facility] Provider Note   CSN: [id number] Arrival date & time: 01/18/23  1403     History  No chief complaint on file.   Gabriel Berger is a 32 y.o. male.  Pt is a 32 yo male with no significant pmhx.  Pt said he woke up this am around 0600 with palpitations.  He went to [facility] and was sent here for further eval.  Pt said he's had palpitations several times in the past, but has never seen a doctor for it.  He has no other sx.       Home Medications Prior to Admission medications   Medication Sig Start Date End Date Taking? Authorizing Provider  apixaban (ELIQUIS) 5 MG TABS tablet Take 1 tablet (5 mg total) by mouth 2 (two) times daily. 01/18/23 02/17/23 Yes Jacalyn Lefevre, MD  cetirizine (ZYRTEC) 10 MG tablet Take 1 tablet (10 mg total) by mouth at bedtime. 06/02/22   Tysinger, Kermit Balo, PA-C  omeprazole (PRILOSEC) 40 MG capsule Take 1 capsule (40 mg total) by mouth daily. 06/02/22   Tysinger, Kermit Balo, PA-C      Allergies    Patient has no known allergies.    Review of Systems   Review of Systems  Cardiovascular:  Positive for palpitations.  All other systems reviewed and are negative.   Physical Exam Updated Vital Signs BP (!) 144/100   Pulse 83   Temp 98.1 F (36.7 C) (Oral)   Resp (!) 24   Ht 5\' 7"  (1.702 m)   Wt 118.5 kg   SpO2 99%   BMI 40.92 kg/m  Physical Exam Vitals and nursing note reviewed.  Constitutional:      Appearance: Normal appearance.  HENT:     Head: Normocephalic and atraumatic.     Right Ear: External ear normal.     Left Ear: External ear normal.     Nose: Nose normal.     Mouth/Throat:     Mouth: Mucous membranes are moist.     Pharynx: Oropharynx is clear.  Eyes:     Extraocular Movements: Extraocular movements intact.     Conjunctiva/sclera: Conjunctivae normal.     Pupils: Pupils are equal, round, and reactive to light.  Cardiovascular:     Rate and Rhythm: Tachycardia present. Rhythm  irregular.     Pulses: Normal pulses.     Heart sounds: Normal heart sounds.  Pulmonary:     Effort: Pulmonary effort is normal.     Breath sounds: Normal breath sounds.  Abdominal:     General: Abdomen is flat. Bowel sounds are normal.     Palpations: Abdomen is soft.  Musculoskeletal:        General: Normal range of motion.     Cervical back: Normal range of motion and neck supple.  Skin:    General: Skin is warm.     Capillary Refill: Capillary refill takes less than 2 seconds.  Neurological:     General: No focal deficit present.     Mental Status: He is alert and oriented to person, place, and time.  Psychiatric:        Mood and Affect: Mood normal.        Behavior: Behavior normal.     ED Results / Procedures / Treatments   Labs (all labs ordered are listed, but only abnormal results are displayed) Labs Reviewed  BASIC METABOLIC PANEL - Abnormal; Notable for the following components:  Result Value   Glucose, Bld 121 (*)    All other components within normal limits  CBC - Abnormal; Notable for the following components:   RBC 5.96 (*)    Hemoglobin 17.2 (*)    All other components within normal limits  MAGNESIUM  TSH  TROPONIN I (HIGH SENSITIVITY)  TROPONIN I (HIGH SENSITIVITY)    EKG EKG Interpretation Date/Time:  Tuesday January 18 2023 17:40:40 EDT Ventricular Rate:  89 PR Interval:  169 QRS Duration:  94 QT Interval:  333 QTC Calculation: 406 R Axis:   25  Text Interpretation: Sinus rhythm RSR' in V1 or V2, probably normal variant Minimal ST depression ST elev, probable normal early repol pattern now in NSR Confirmed by Jacalyn Lefevre [phone]) on 01/18/2023 6:47:45 PM  Radiology DG Chest Portable 1 View  Result Date: 01/18/2023 CLINICAL DATA:  afib EXAM: PORTABLE CHEST 1 VIEW COMPARISON:  June 30th 2006 FINDINGS: The cardiomediastinal silhouette is normal in contour. No pleural effusion. No pneumothorax. No acute pleuroparenchymal abnormality.  IMPRESSION: No acute cardiopulmonary abnormality. Electronically Signed   By: Meda Klinefelter M.D.   On: 01/18/2023 16:31    Procedures .Cardioversion  Date/Time: 01/18/2023 5:47 PM  Performed by: Jacalyn Lefevre, MD Authorized by: Jacalyn Lefevre, MD   Consent:    Consent obtained:  Written   Consent given by:  Patient   Alternatives discussed:  No treatment Pre-procedure details:    Cardioversion basis:  Emergent   Rhythm:  Atrial fibrillation   Electrode placement:  Anterior-posterior Patient sedated: Yes. Refer to sedation procedure documentation for details of sedation.  Attempt one:    Shock (Joules):  200   Shock outcome:  Conversion to normal sinus rhythm Post-procedure details:    Patient tolerance of procedure:  Tolerated well, no immediate complications .Sedation  Date/Time: 01/18/2023 5:48 PM  Performed by: Jacalyn Lefevre, MD Authorized by: Jacalyn Lefevre, MD   Consent:    Consent obtained:  Written Universal protocol:    Immediately prior to procedure, a time out was called: yes     Patient identity confirmed:  Verbally with patient Indications:    Procedure performed:  Cardioversion Pre-sedation assessment:    Time since last food or drink:  5   ASA classification: class 2 - patient with mild systemic disease     Mallampati score:  II - soft palate, uvula, fauces visible   Pre-sedation assessments completed and reviewed: airway patency, cardiovascular function, hydration status, mental status, nausea/vomiting, pain level, respiratory function and temperature   Procedure details (see MAR for exact dosages):    Preoxygenation:  Room air   Sedation:  Etomidate and midazolam   Intended level of sedation: deep   Intra-procedure monitoring:  Blood pressure monitoring, continuous capnometry, cardiac monitor, continuous pulse oximetry, frequent LOC assessments and frequent vital sign checks   Intra-procedure events: none     Total Provider sedation time  (minutes):  30 Post-procedure details:    Post-sedation assessment completed:  01/18/2023 6:47 PM   Attendance: Constant attendance by certified staff until patient recovered     Recovery: Patient returned to pre-procedure baseline     Post-sedation assessments completed and reviewed: airway patency, cardiovascular function, hydration status, mental status, nausea/vomiting, pain level, respiratory function and temperature     Patient is stable for discharge or admission: yes     Procedure completion:  Tolerated well, no immediate complications     Medications Ordered in ED Medications  metoprolol tartrate (LOPRESSOR) injection 5 mg (5 mg Intravenous  Given 01/18/23 1602)  etomidate (AMIDATE) injection 10 mg (10 mg Intravenous Given 01/18/23 1738)  midazolam (VERSED) injection 1 mg (1 mg Intravenous Given 01/18/23 1738)  apixaban (ELIQUIS) tablet 5 mg (5 mg Oral Given 01/18/23 1750)    ED Course/ Medical Decision Making/ A&P                                 Medical Decision Making Amount and/or Complexity of Data Reviewed Labs: ordered. Radiology: ordered.  Risk Prescription drug management.   This patient presents to the ED for concern of palpitations, this involves an extensive number of treatment options, and is a complaint that carries with it a high risk of complications and morbidity.  The differential diagnosis includes afib, svt, st   Co morbidities that complicate the patient evaluation  none   Additional history obtained:  Additional history obtained from epic chart review External records from outside source obtained and reviewed including family   Lab Tests:  I Ordered, and personally interpreted labs.  The pertinent results include:  nl cbc, bmp nl, mg nl, trop nl   Imaging Studies ordered:  I ordered imaging studies including cxr  I independently visualized and interpreted imaging which showed No acute cardiopulmonary abnormality.  I agree with the  radiologist interpretation   Cardiac Monitoring:  The patient was maintained on a cardiac monitor.  I personally viewed and interpreted the cardiac monitored which showed an underlying rhythm of: afib   Medicines ordered and prescription drug management:  I ordered medication including lopressor  for afib  Reevaluation of the patient after these medicines showed that the patient improved I have reviewed the patients home medicines and have made adjustments as needed  Critical Interventions:  cardioversion   Consultations Obtained:  I requested consultation with the cardiologist (Dr. Flora Lipps),  and discussed lab and imaging findings as well as pertinent plan - he recommended cardioversion and 1 month supply of Eliquis.  He will make an appt for pt in the afib clinic.   Problem List / ED Course:  New onset afib. Chadvasc 0.  Pt now in NSR after cardioversion.   Pt will be started on eliquis and will f/u in the afib clinic.  He knows to avoid alcohol.   Reevaluation:  After the interventions noted above, I reevaluated the patient and found that they have :improved   Social Determinants of Health:  Lives at home   Dispostion:  After consideration of the diagnostic results and the patients response to treatment, I feel that the patent would benefit from discharge with outpatient f/u.          Final Clinical Impression(s) / ED Diagnoses Final diagnoses:  New onset atrial fibrillation [facility])    Rx / DC Orders ED Discharge Orders          Ordered    apixaban (ELIQUIS) 5 MG TABS tablet  2 times daily        01/18/23 1746              Jacalyn Lefevre, MD 01/18/23 [phone]

## 2023-01-18 NOTE — Sedation Documentation (Signed)
200J shock delivered at this time by [facility] MD

## 2023-01-18 NOTE — ED Notes (Signed)
Per Dr Particia Nearing do not have to collect the Troponin that was due at 1618. Patient can be d/c

## 2023-01-21 ENCOUNTER — Encounter (HOSPITAL_COMMUNITY): Payer: Self-pay | Admitting: Physician Assistant

## 2023-01-21 ENCOUNTER — Ambulatory Visit (HOSPITAL_COMMUNITY)
Admission: RE | Admit: 2023-01-21 | Discharge: 2023-01-21 | Disposition: A | Discharge status: Home / Self Care | Payer: BLUE CROSS/BLUE SHIELD | Source: Ambulatory Visit | Attending: Physician Assistant | Admitting: Physician Assistant

## 2023-01-21 VITALS — BP 122/76 | HR 80 | Ht 67.0 in | Wt 260.8 lb

## 2023-01-21 DIAGNOSIS — Z7901 Long term (current) use of anticoagulants: Secondary | ICD-10-CM | POA: Insufficient documentation

## 2023-01-21 DIAGNOSIS — I480 Paroxysmal atrial fibrillation: Secondary | ICD-10-CM | POA: Diagnosis not present

## 2023-01-21 DIAGNOSIS — R002 Palpitations: Secondary | ICD-10-CM | POA: Diagnosis not present

## 2023-01-21 HISTORY — DX: Unspecified atrial fibrillation: I48.91

## 2023-01-21 NOTE — Progress Notes (Signed)
Primary Care Physician: Jac Canavan, PA-C Primary Cardiologist: None Electrophysiologist: None  Referring Physician: [facility]   Gabriel Berger is a 32 y.o. male with a history of atrial fibrillation who presents for consultation in the [facility].  The patient was initially diagnosed with atrial fibrillation 01/18/23 after presenting to urgent care with symptoms of palpitations. ECG showed afib with RVR and he was sent to the ED where he underwent DCCV. Patient was started on Eliquis for a CHADS2VASC score of 0.  Today, patient reports that he has done well since his ED visit with no further symptoms. He does admit to heavy alcohol use during his birthday the couple days before the onset of his afib. He denies significant snoring. No bleeding issues on anticoagulation.   Today, he denies symptoms of palpitations, chest pain, shortness of breath, orthopnea, PND, lower extremity edema, dizziness, presyncope, syncope, snoring, daytime somnolence, bleeding, or neurologic sequela. The patient is tolerating medications without difficulties and is otherwise without complaint today.    Atrial Fibrillation Risk Factors:  he does not have symptoms or diagnosis of sleep apnea. he does not have a history of rheumatic fever. he does have a history of alcohol use. The patient does not have a history of early familial atrial fibrillation or other arrhythmias.  Atrial Fibrillation Management history:  Previous antiarrhythmic drugs: none Previous cardioversions: 01/18/23 Previous ablations: none Anticoagulation history: Eliquis  ROS- All systems are reviewed and negative except as per the HPI above.  Past Medical History:  Diagnosis Date   Atrial fibrillation [facility])     Current Outpatient Medications  Medication Sig Dispense Refill   apixaban (ELIQUIS) 5 MG TABS tablet Take 1 tablet (5 mg total) by mouth 2 (two) times daily. 60 tablet 0   No current  facility-administered medications for this encounter.    Physical Exam: BP 122/76   Pulse 80   Ht 5\' 7"  (1.702 m)   Wt 118.3 kg   BMI 40.85 kg/m   GEN: Well nourished, well developed in no acute distress NECK: No JVD; No carotid bruits CARDIAC: Regular rate and rhythm, no murmurs, rubs, gallops RESPIRATORY:  Clear to auscultation without rales, wheezing or rhonchi  ABDOMEN: Soft, non-tender, non-distended EXTREMITIES:  No edema; No deformity   Wt Readings from Last 3 Encounters:  01/21/23 118.3 kg  01/18/23 118.5 kg  06/02/22 118.5 kg     EKG today demonstrates  SR, repol abnormality  Vent. rate 80 BPM PR interval 164 ms QRS duration 90 ms QT/QTcB 346/399 ms   CHA2DS2-VASc Score = 0  The patient's score is based upon: CHF History: 0 HTN History: 0 Diabetes History: 0 Stroke History: 0 Vascular Disease History: 0 Age Score: 0 Gender Score: 0       ASSESSMENT AND PLAN: Paroxysmal Atrial Fibrillation (ICD10:  I48.0) The patient's CHA2DS2-VASc score is 0, indicating a 0.2% annual risk of stroke.   General education about afib provided and questions answered. We also discussed his stroke risk and the risks and benefits of anticoagulation. Check echocardiogram  Continue Eliquis for 4 weeks post DCCV then discontinue with low CV score.  Discussed association between alcohol and afib. If he has more afib, would prefer to send for ablation consideration given his young age although is BMI is borderline.    Follow up in the AF clinic in 3 months.        Jorja Loa PA-C Afib Clinic [facility] [address]  [address] [place], [place] [address] [phone]

## 2023-01-21 NOTE — Patient Instructions (Signed)
Stop Eliquis once current bottle completed

## 2023-02-22 ENCOUNTER — Ambulatory Visit (HOSPITAL_COMMUNITY)
Admission: RE | Admit: 2023-02-22 | Discharge: 2023-02-22 | Discharge status: Home / Self Care | Payer: BC Managed Care – PPO | Source: Ambulatory Visit | Attending: Physician Assistant

## 2023-02-22 DIAGNOSIS — I480 Paroxysmal atrial fibrillation: Secondary | ICD-10-CM | POA: Diagnosis not present

## 2023-02-22 DIAGNOSIS — Z8249 Family history of ischemic heart disease and other diseases of the circulatory system: Secondary | ICD-10-CM | POA: Insufficient documentation

## 2023-02-22 LAB — ECHOCARDIOGRAM COMPLETE
Area-P 1/2: 4.04 cm2
S' Lateral: 3.7 cm

## 2023-02-22 NOTE — Progress Notes (Signed)
  Echocardiogram 2D Echocardiogram has been performed.  Milda Smart 02/22/2023, 3:43 PM

## 2023-04-15 ENCOUNTER — Ambulatory Visit (HOSPITAL_COMMUNITY): Payer: BLUE CROSS/BLUE SHIELD | Admitting: Physician Assistant

## 2023-05-27 DIAGNOSIS — R030 Elevated blood-pressure reading, without diagnosis of hypertension: Secondary | ICD-10-CM | POA: Diagnosis not present

## 2023-05-27 DIAGNOSIS — Z202 Contact with and (suspected) exposure to infections with a predominantly sexual mode of transmission: Secondary | ICD-10-CM | POA: Diagnosis not present

## 2023-09-05 ENCOUNTER — Ambulatory Visit (HOSPITAL_COMMUNITY): Admitting: Physician Assistant

## 2023-09-30 ENCOUNTER — Encounter (HOSPITAL_COMMUNITY): Payer: Self-pay | Admitting: Physician Assistant

## 2023-09-30 ENCOUNTER — Ambulatory Visit (HOSPITAL_COMMUNITY): Admitting: Physician Assistant

## 2023-09-30 ENCOUNTER — Ambulatory Visit (HOSPITAL_COMMUNITY)
Admission: RE | Admit: 2023-09-30 | Discharge: 2023-09-30 | Disposition: A | Discharge status: Home / Self Care | Source: Ambulatory Visit | Attending: Internal Medicine | Admitting: Internal Medicine

## 2023-09-30 VITALS — BP 150/90 | HR 82 | Ht 67.0 in | Wt 268.6 lb

## 2023-09-30 DIAGNOSIS — E66813 Obesity, class 3: Secondary | ICD-10-CM | POA: Diagnosis not present

## 2023-09-30 DIAGNOSIS — I10 Essential (primary) hypertension: Secondary | ICD-10-CM

## 2023-09-30 DIAGNOSIS — I480 Paroxysmal atrial fibrillation: Secondary | ICD-10-CM

## 2023-09-30 DIAGNOSIS — Z6841 Body Mass Index (BMI) 40.0 and over, adult: Secondary | ICD-10-CM

## 2023-09-30 MED ORDER — DILTIAZEM HCL ER COATED BEADS 120 MG PO CP24: 120.0000 mg | ORAL_CAPSULE | Freq: Every day | ORAL | 11 refills | Status: AC

## 2023-09-30 NOTE — Progress Notes (Signed)
 Primary Care Physician: Pcp, No Primary Cardiologist: None Electrophysiologist: None  Referring Physician: [facility]   Gabriel Berger is a 33 y.o. male with a history of HTN and atrial fibrillation who presents for follow up in the [facility].  The patient was initially diagnosed with atrial fibrillation 01/18/23 after presenting to urgent care with symptoms of palpitations. ECG showed afib with RVR and he was sent to the ED where he underwent DCCV. Patient was started on Eliquis  for a CHADS2VASC score of 0.  Patient returns for follow up for atrial fibrillation. He is currently in SR. S/p ED visit in [place]  on 5/7 for PAF discharged on Eliquis  5 mg BID and diltiazem 60 mg four times daily. He reports that he converted soon after leaving the ED. The entire episode lasted < 24 hours. There were no specific triggers, he denies any alcohol use prior.   Today, he  denies symptoms of palpitations, chest pain, shortness of breath, orthopnea, PND, lower extremity edema, dizziness, presyncope, syncope, snoring, daytime somnolence, bleeding, or neurologic sequela. The patient is tolerating medications without difficulties and is otherwise without complaint today.    Atrial Fibrillation Risk Factors:  he does not have symptoms or diagnosis of sleep apnea. Previous negative sleep study.  he does not have a history of rheumatic fever. he does have a history of alcohol use. The patient does not have a history of early familial atrial fibrillation or other arrhythmias.  Atrial Fibrillation Management history:  Previous antiarrhythmic drugs: none Previous cardioversions: 01/18/23 Previous ablations: none Anticoagulation history: Eliquis   ROS- All systems are reviewed and negative except as per the HPI above.  Past Medical History:  Diagnosis Date   Atrial fibrillation [facility])     Current Outpatient Medications  Medication Sig Dispense Refill   diltiazem (CARDIZEM CD)  120 MG 24 hr capsule Take 1 capsule (120 mg total) by mouth daily. 30 capsule 11   No current facility-administered medications for this encounter.    Physical Exam: BP (!) 150/90   Pulse 82   Ht 5\' 7"  (1.702 m)   Wt 121.8 kg   BMI 42.07 kg/m   GEN: Well nourished, well developed in no acute distress CARDIAC: Regular rate and rhythm, no murmurs, rubs, gallops RESPIRATORY:  Clear to auscultation without rales, wheezing or rhonchi  ABDOMEN: Soft, non-tender, non-distended EXTREMITIES:  No edema; No deformity    Wt Readings from Last 3 Encounters:  09/30/23 121.8 kg  01/21/23 118.3 kg  01/18/23 118.5 kg     EKG today demonstrates  SR Vent. rate 82 BPM PR interval 168 ms QRS duration 88 ms QT/QTcB 338/394 ms   Echo 02/22/23  1. Left ventricular ejection fraction, by estimation, is 60 to 65%. The  left ventricle has normal function. The left ventricle has no regional  wall motion abnormalities. Left ventricular diastolic parameters are  indeterminate.   2. Right ventricular systolic function is normal. The right ventricular  size is normal.   3. The mitral valve is normal in structure. No evidence of mitral valve  regurgitation. No evidence of mitral stenosis.   4. The aortic valve is normal in structure. Aortic valve regurgitation is  not visualized. No aortic stenosis is present.   5. The inferior vena cava is normal in size with greater than 50%  respiratory variability, suggesting right atrial pressure of 3 mmHg.    CHA2DS2-VASc Score = 1  The patient's score is based upon: CHF History: 0  HTN History: 1 Diabetes History: 0 Stroke History: 0 Vascular Disease History: 0 Age Score: 0 Gender Score: 0       ASSESSMENT AND PLAN: Paroxysmal Atrial Fibrillation (ICD10:  I48.0) The patient's CHA2DS2-VASc score is 1, indicating a 0.6% annual risk of stroke.   Patient in SR. We discussed rhythm control options today. Will start diltiazem 120 mg daily Now off  Eliquis  with low CV score  HTN Elevated today. Patient reports it is frequently elevated at other appointments.  Start diltiazem as above.   Obesity Body mass index is 42.07 kg/m.  Encouraged lifestyle modification Goal weight loss is 10% body weight.    Follow up in the AF clinic in one month.      Myrtha Ates PA-C [facility] [address] [place], [place] [address] [phone]

## 2023-09-30 NOTE — Patient Instructions (Addendum)
 Stop eliquis    Start diltiazem 120 mg daily

## 2023-10-25 ENCOUNTER — Ambulatory Visit (HOSPITAL_COMMUNITY): Admitting: Physician Assistant
